# Patient Record
Sex: Male | Born: 1960 | Race: White | Hispanic: No | Smoking: Current every day smoker
Health system: Southern US, Community
[De-identification: ages and names within clinical notes are randomized; demographics above are authoritative.]

---

## 2005-01-05 ENCOUNTER — Emergency Department: Payer: Self-pay | Admitting: Emergency Medicine

## 2011-01-08 ENCOUNTER — Emergency Department: Payer: Self-pay | Admitting: Internal Medicine

## 2015-02-22 ENCOUNTER — Emergency Department
Admission: EM | Admit: 2015-02-22 | Discharge: 2015-02-22 | Disposition: A | Discharge status: Home / Self Care | Payer: Self-pay | Attending: Emergency Medicine | Admitting: Emergency Medicine

## 2015-02-22 ENCOUNTER — Emergency Department: Payer: Self-pay

## 2015-02-22 ENCOUNTER — Encounter: Payer: Self-pay | Admitting: Emergency Medicine

## 2015-02-22 DIAGNOSIS — R109 Unspecified abdominal pain: Secondary | ICD-10-CM | POA: Insufficient documentation

## 2015-02-22 DIAGNOSIS — Z720 Tobacco use: Secondary | ICD-10-CM | POA: Insufficient documentation

## 2015-02-22 LAB — COMPREHENSIVE METABOLIC PANEL
ALK PHOS: 55 U/L (ref 38–126)
ALT: 17 U/L (ref 17–63)
AST: 20 U/L (ref 15–41)
Albumin: 4.1 g/dL (ref 3.5–5.0)
Anion gap: 5 (ref 5–15)
BUN: 13 mg/dL (ref 6–20)
CALCIUM: 9 mg/dL (ref 8.9–10.3)
CO2: 27 mmol/L (ref 22–32)
CREATININE: 0.84 mg/dL (ref 0.61–1.24)
Chloride: 103 mmol/L (ref 101–111)
Glucose, Bld: 119 mg/dL — ABNORMAL HIGH (ref 65–99)
Potassium: 4 mmol/L (ref 3.5–5.1)
Sodium: 135 mmol/L (ref 135–145)
Total Bilirubin: 0.7 mg/dL (ref 0.3–1.2)
Total Protein: 7.6 g/dL (ref 6.5–8.1)

## 2015-02-22 LAB — CBC WITH DIFFERENTIAL/PLATELET
BASOS ABS: 0 10*3/uL (ref 0–0.1)
Basophils Relative: 0 %
Eosinophils Absolute: 0.1 10*3/uL (ref 0–0.7)
Eosinophils Relative: 1 %
HCT: 48.7 % (ref 40.0–52.0)
HEMOGLOBIN: 16.7 g/dL (ref 13.0–18.0)
LYMPHS ABS: 2.4 10*3/uL (ref 1.0–3.6)
LYMPHS PCT: 21 %
MCH: 31 pg (ref 26.0–34.0)
MCHC: 34.3 g/dL (ref 32.0–36.0)
MCV: 90.3 fL (ref 80.0–100.0)
Monocytes Absolute: 0.8 10*3/uL (ref 0.2–1.0)
Monocytes Relative: 7 %
NEUTROS ABS: 8.2 10*3/uL — AB (ref 1.4–6.5)
NEUTROS PCT: 71 %
Platelets: 232 10*3/uL (ref 150–440)
RBC: 5.4 MIL/uL (ref 4.40–5.90)
RDW: 12.9 % (ref 11.5–14.5)
WBC: 11.5 10*3/uL — AB (ref 3.8–10.6)

## 2015-02-22 LAB — URINALYSIS COMPLETE WITH MICROSCOPIC (ARMC ONLY)
BACTERIA UA: NONE SEEN
BILIRUBIN URINE: NEGATIVE
GLUCOSE, UA: NEGATIVE mg/dL
Ketones, ur: NEGATIVE mg/dL
Leukocytes, UA: NEGATIVE
NITRITE: NEGATIVE
Protein, ur: NEGATIVE mg/dL
Specific Gravity, Urine: 1.002 — ABNORMAL LOW (ref 1.005–1.030)
pH: 7 (ref 5.0–8.0)

## 2015-02-22 MED ORDER — MORPHINE SULFATE (PF) 4 MG/ML IV SOLN
INTRAVENOUS | Status: AC
  Administered 2015-02-22: 4 mg via INTRAVENOUS
  Filled 2015-02-22: qty 1

## 2015-02-22 MED ORDER — HYDROCHLOROTHIAZIDE 12.5 MG PO CAPS: 12.5000 mg | ORAL_CAPSULE | Freq: Every day | ORAL | Status: AC

## 2015-02-22 MED ORDER — SODIUM CHLORIDE 0.9 % IV BOLUS (SEPSIS)
1000.0000 mL | Freq: Once | INTRAVENOUS | Status: AC
  Administered 2015-02-22: 1000 mL via INTRAVENOUS

## 2015-02-22 MED ORDER — ONDANSETRON HCL 4 MG/2ML IJ SOLN
4.0000 mg | Freq: Once | INTRAMUSCULAR | Status: AC
  Administered 2015-02-22: 4 mg via INTRAVENOUS

## 2015-02-22 MED ORDER — CYCLOBENZAPRINE HCL 10 MG PO TABS: 10.0000 mg | ORAL_TABLET | Freq: Three times a day (TID) | ORAL | Status: AC | PRN

## 2015-02-22 MED ORDER — ONDANSETRON HCL 4 MG/2ML IJ SOLN
INTRAMUSCULAR | Status: AC
  Administered 2015-02-22: 4 mg via INTRAVENOUS
  Filled 2015-02-22: qty 2

## 2015-02-22 MED ORDER — MORPHINE SULFATE (PF) 4 MG/ML IV SOLN
4.0000 mg | Freq: Once | INTRAVENOUS | Status: AC
Start: 2015-02-22 — End: 2015-02-22
  Administered 2015-02-22: 4 mg via INTRAVENOUS

## 2015-02-22 NOTE — Discharge Instructions (Signed)
Flank Pain °Flank pain refers to pain that is located on the side of the body between the upper abdomen and the back. The pain may occur over a short period of time (acute) or may be long-term or reoccurring (chronic). It may be mild or severe. Flank pain can be caused by many things. °CAUSES  °Some of the more common causes of flank pain include: °· Muscle strains.   °· Muscle spasms.   °· A disease of your spine (vertebral disk disease).   °· A lung infection (pneumonia).   °· Fluid around your lungs (pulmonary edema).   °· A kidney infection.   °· Kidney stones.   °· A very painful skin rash caused by the chickenpox virus (shingles).   °· Gallbladder disease.   °HOME CARE INSTRUCTIONS  °Home care will depend on the cause of your pain. In general, °· Rest as directed by your caregiver. °· Drink enough fluids to keep your urine clear or pale yellow. °· Only take over-the-counter or prescription medicines as directed by your caregiver. Some medicines may help relieve the pain. °· Tell your caregiver about any changes in your pain. °· Follow up with your caregiver as directed. °SEEK IMMEDIATE MEDICAL CARE IF:  °· Your pain is not controlled with medicine.   °· You have new or worsening symptoms. °· Your pain increases.   °· You have abdominal pain.   °· You have shortness of breath.   °· You have persistent nausea or vomiting.   °· You have swelling in your abdomen.   °· You feel faint or pass out.   °· You have blood in your urine. °· You have a fever or persistent symptoms for more than 2-3 days. °· You have a fever and your symptoms suddenly get worse. °MAKE SURE YOU:  °· Understand these instructions. °· Will watch your condition. °· Will get help right away if you are not doing well or get worse. °  °This information is not intended to replace advice given to you by your health care provider. Make sure you discuss any questions you have with your health care provider. °  °Document Released: 06/22/2005 Document  Revised: 01/24/2012 Document Reviewed: 12/14/2011 °Elsevier Interactive Patient Education ©2016 [employer] ° °

## 2015-02-22 NOTE — ED Provider Notes (Signed)
[facility] Emergency Department Provider Note  ____________________________________________  Time seen: Approximately 9:10 AM  I have reviewed the triage vital signs and the nursing notes.   HISTORY  Chief Complaint Flank Pain    HPI Curtis Beck is a 54 y.o. male without any medical history was presenting today with 5 days of left flank pain which is continuous and achy. It is not worsened when the patient pushes on it. He has no injury. He had some difficulty starting and stopping his urinary stream earlier in the week but this has since resolved. He denies any gross blood in his urine. Has a brother with a history of kidney stones. Says he has not seen a physician in "a long time." However, his wife does frequently take his blood pressure with a home blood pressure cuff and has readings of about 160s over 80s. He says his pain is a 1010 at this time.   History reviewed. No pertinent past medical history.  There are no active problems to display for this patient.   History reviewed. No pertinent past surgical history.  No current outpatient prescriptions on file.  Allergies Review of patient's allergies indicates no known allergies.  History reviewed. No pertinent family history.  Social History Social History  Substance Use Topics  . Smoking status: Current Every Day Smoker  . Smokeless tobacco: None  . Alcohol Use: Yes    Review of Systems Constitutional: No fever/chills Eyes: No visual changes. ENT: No sore throat. Cardiovascular: Denies chest pain. Respiratory: Denies shortness of breath. Gastrointestinal: No abdominal pain.  No nausea, no vomiting.  No diarrhea.  No constipation. Genitourinary: Negative for dysuria. Musculoskeletal: Left flank pain.  Skin: Negative for rash. Neurological: Negative for headaches, focal weakness or numbness.  10-point ROS otherwise  negative.  ____________________________________________   PHYSICAL EXAM:  VITAL SIGNS: ED Triage Vitals  Enc Vitals Group     BP 02/22/15 0901 210/113 mmHg     Pulse Rate 02/22/15 0901 78     Resp 02/22/15 0901 20     Temp 02/22/15 0901 97.6 F (36.4 C)     Temp Source 02/22/15 0901 Oral     SpO2 02/22/15 0901 100 %     Weight 02/22/15 0901 220 lb (99.791 kg)     Height 02/22/15 0901  (1.854 m)     Head Cir --      Peak Flow --      Pain Score 02/22/15 0901 10     Pain Loc --      Pain Edu? --      Excl. in GC? --     Constitutional: Alert and oriented. Well appearing and in no acute distress. Eyes: Conjunctivae are normal. PERRL. EOMI. Head: Atraumatic. Nose: No congestion/rhinnorhea. Mouth/Throat: Mucous membranes are moist.  Oropharynx non-erythematous. Neck: No stridor.   Cardiovascular: Normal rate, regular rhythm. Grossly normal heart sounds.  Good peripheral circulation. Patient with equal and present bilateral radial as well as dorsalis pedis pulses. Respiratory: Normal respiratory effort.  No retractions. Lungs CTAB. Gastrointestinal: Soft and nontender. No distention. No abdominal bruits. No CVA tenderness. Musculoskeletal: No lower extremity tenderness nor edema.  No joint effusions. Neurologic:  Normal speech and language. No gross focal neurologic deficits are appreciated. No gait instability. Skin:  Skin is warm, dry and intact. No rash noted. Psychiatric: Mood and affect are normal. Speech and behavior are normal.  ____________________________________________   LABS (all labs ordered are listed, but only abnormal results are  displayed)  Labs Reviewed  CBC WITH DIFFERENTIAL/PLATELET - Abnormal; Notable for the following:    WBC 11.5 (*)    Neutro Abs 8.2 (*)    All other components within normal limits  COMPREHENSIVE METABOLIC PANEL - Abnormal; Notable for the following:    Glucose, Bld 119 (*)    All other components within normal limits   URINALYSIS COMPLETEWITH MICROSCOPIC ([facility] ONLY) - Abnormal; Notable for the following:    Color, Urine STRAW (*)    APPearance CLEAR (*)    Specific Gravity, Urine 1.002 (*)    Hgb urine dipstick 2+ (*)    Squamous Epithelial / LPF 0-5 (*)    All other components within normal limits   ____________________________________________  EKG   ____________________________________________  RADIOLOGY  No nephrolithiasis. Rectal thickening of unclear origin. ____________________________________________   PROCEDURES    ____________________________________________   INITIAL IMPRESSION / ASSESSMENT AND PLAN / ED COURSE  Pertinent labs & imaging results that were available during my care of the patient were reviewed by me and considered in my medical decision making (see chart for details).  ----------------------------------------- 11:52 AM on 02/22/2015 -----------------------------------------  Patient is resting comfortably at this time. He says he still has a throbbing to his left lower back but it is now 4 out of 10. He says that he has had this pain in the past and it has been related to working under the car's. 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 However he says it has not lasted this long in the past. He says that here for any gave him some Percocet at home which relieved the pain. Less likely to be aortic pathology given intact pulses throughout and has had these symptoms since this past Wednesday. I would suspect that he would have further lab abnormality such as kidney failure or worsening of symptoms if this was an aortic catastrophe. I will start the patient on hydrochlorothiazide for his high blood pressure which does appear chronic. I also discussed CAT scan read with him including the rectal thickening. He has not had a colonoscopy. He also denies a history of colon cancer in his family. He will take  the report with him to his follow-up appointment at [facility]. ____________________________________________   FINAL CLINICAL IMPRESSION(S) / ED DIAGNOSES  Final diagnoses:  Left flank pain      Myrna Blazer, MD 02/22/15 1155

## 2015-02-22 NOTE — ED Notes (Signed)
Pt to ed with c/o left flank pain since Wed.  Pt states the pain is continuous and achy.  Pt reports on wed and Thursday he had trouble starting and stopping the flow of urine but since has not had difficulty with urination.

## 2015-07-14 DEATH — deceased

## 2017-02-09 IMAGING — CT CT RENAL STONE PROTOCOL
1 of 2 series · 15 of 32 positions shown, 19 images · non-contrast
Comparison: Report 01/05/2005 no images available

CLINICAL DATA: Left flank pain starting last [REDACTED]

EXAM:
CT ABDOMEN AND PELVIS WITHOUT CONTRAST
TECHNIQUE: Multidetector CT imaging of the abdomen and pelvis was performed
following the standard protocol without IV contrast.

[Series 2: stone standard full · axial · 0.80mm/px · z∈[-252,+232]mm · 15 of 107 slices shown, 19 images]
[im 5/107  soft-tissue]
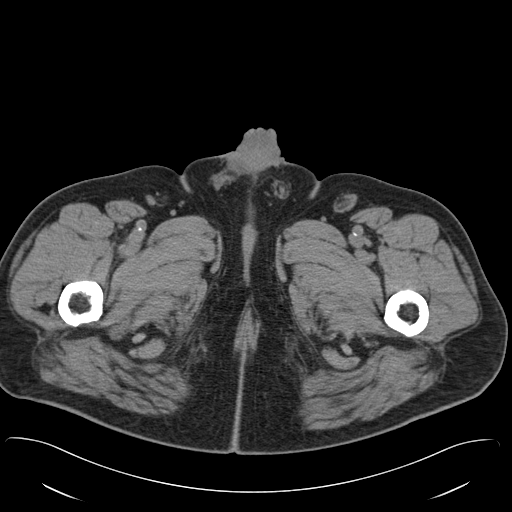
[im 5/107  bone]
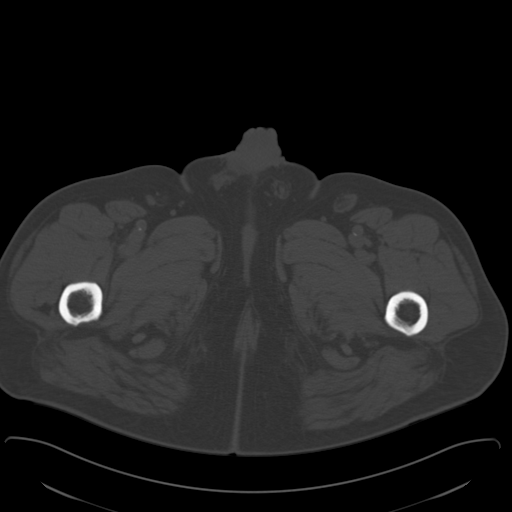
[im 14/107  soft-tissue]
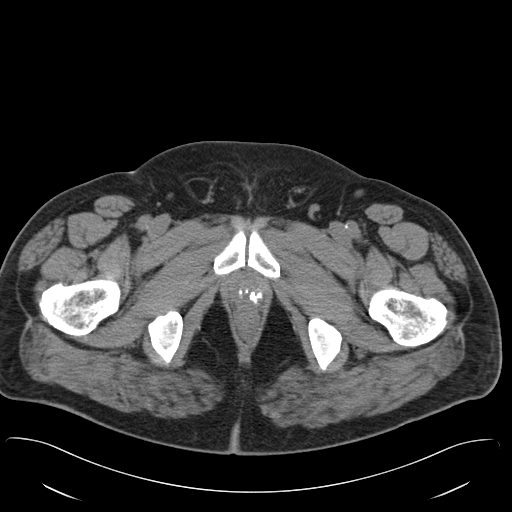
[im 24/107  soft-tissue]
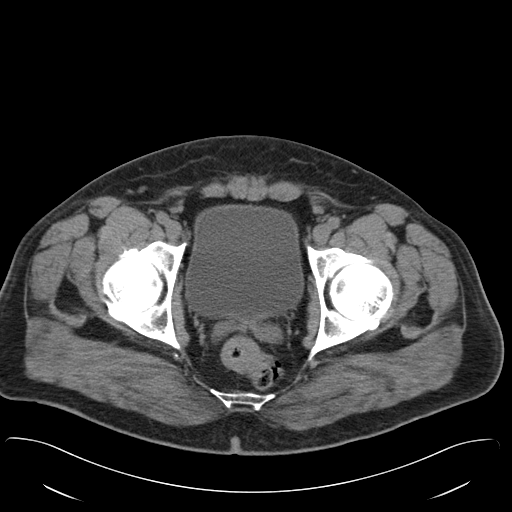
[im 28/107  soft-tissue]
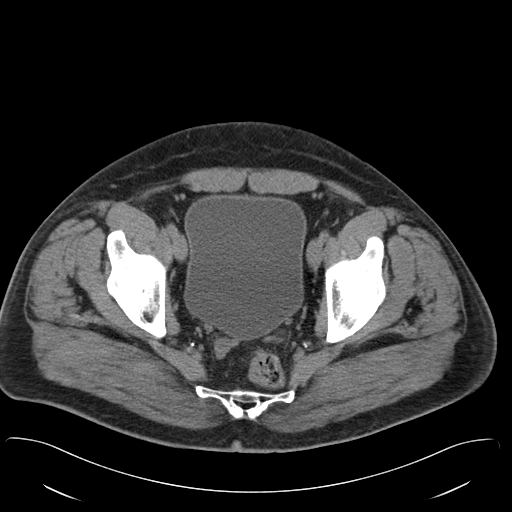
[im 37/107  soft-tissue]
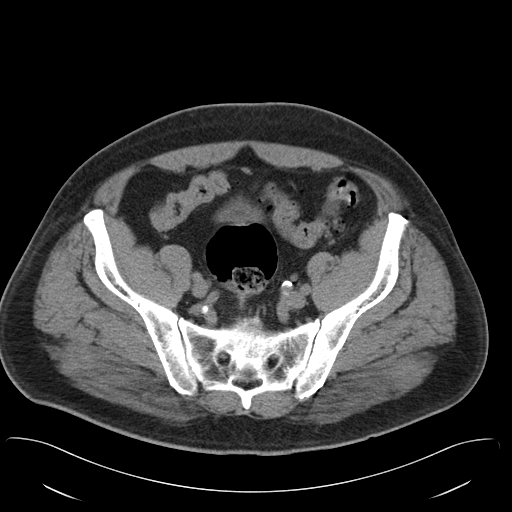
[im 47/107  soft-tissue]
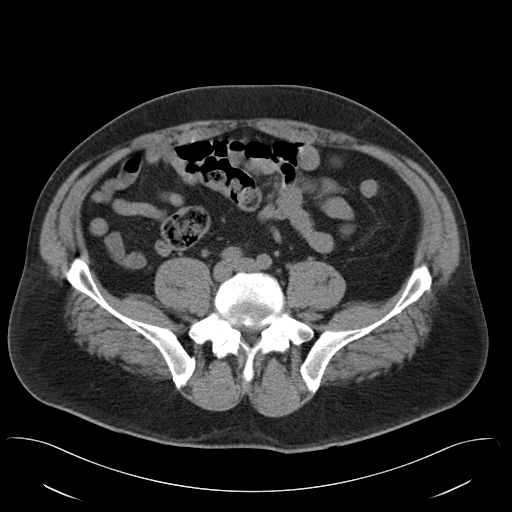
[im 56/107  soft-tissue]
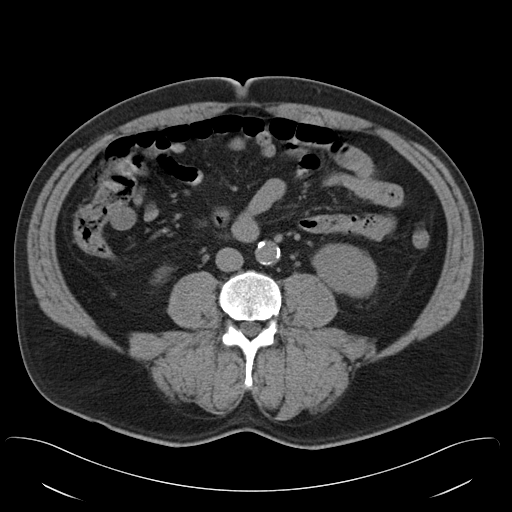
[im 60/107  soft-tissue]
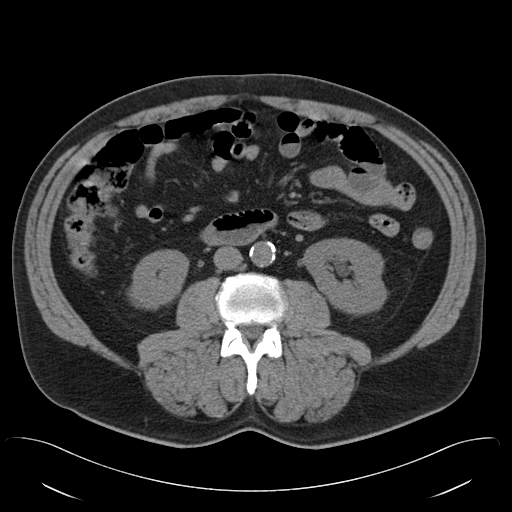
[im 70/107  soft-tissue]
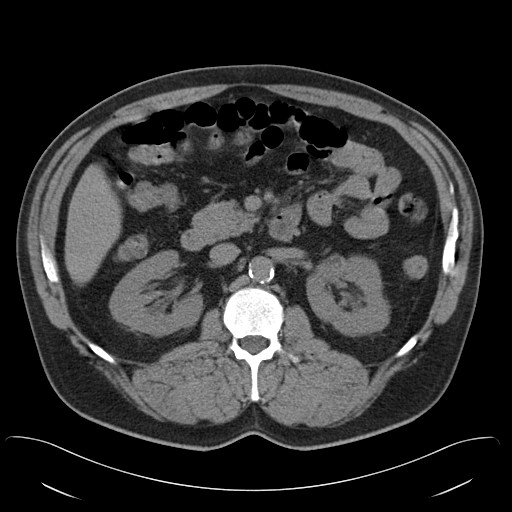
[im 70/107  bone]
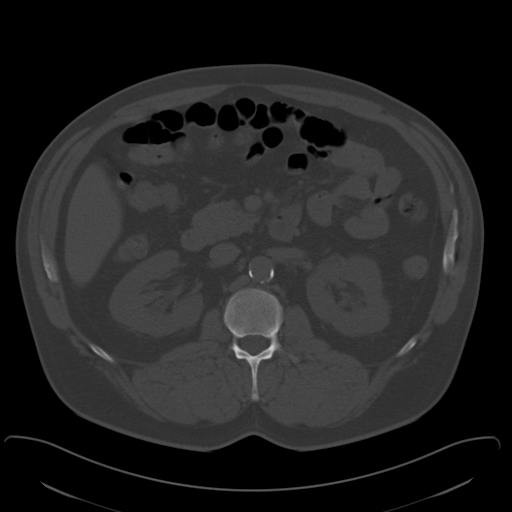
[im 79/107  soft-tissue]
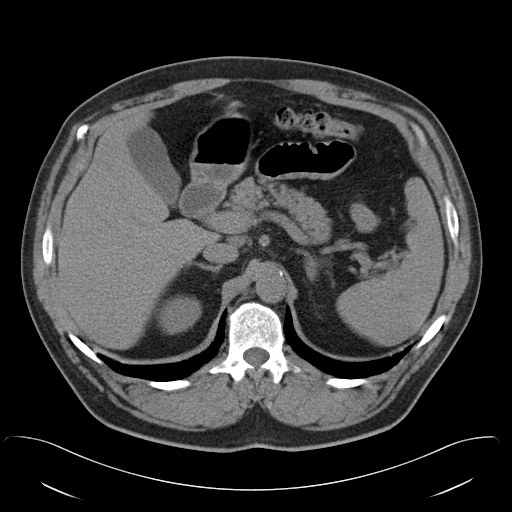
[im 83/107  soft-tissue]
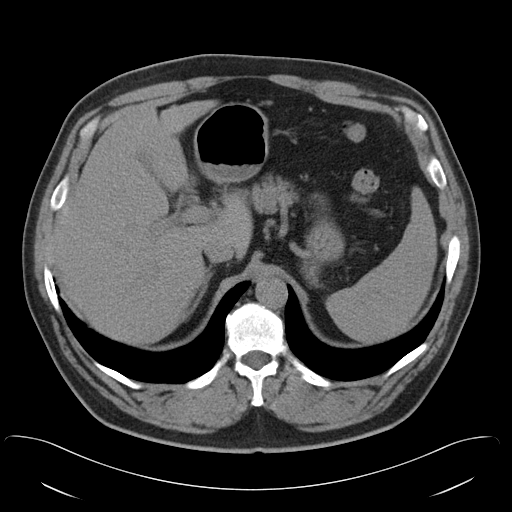
[im 88/107  lung]
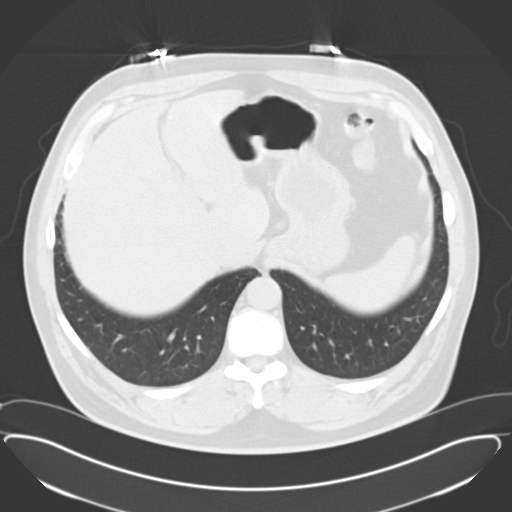
[im 93/107  soft-tissue]
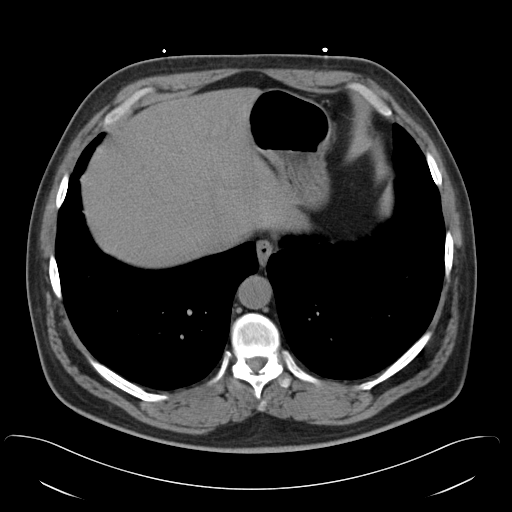
[im 93/107  lung]
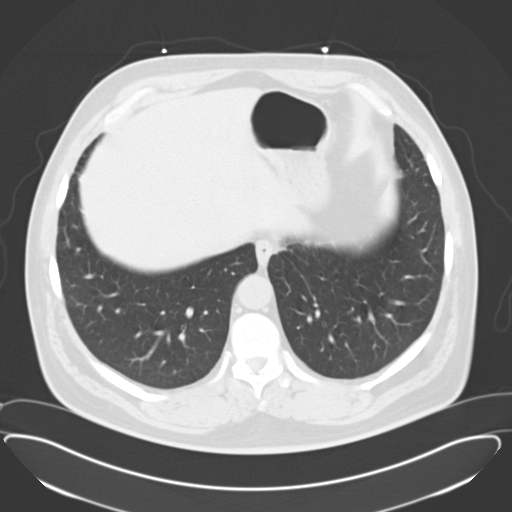
[im 97/107  lung]
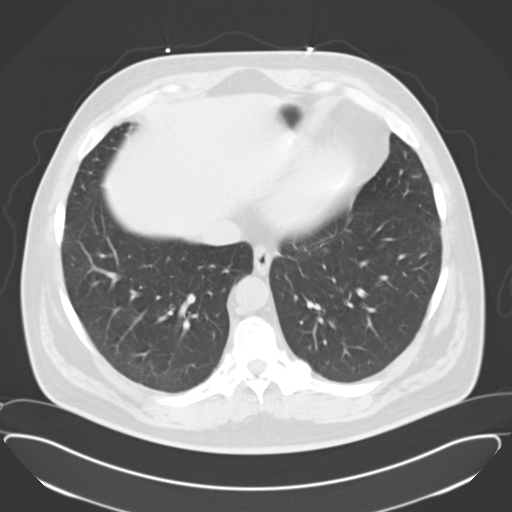
[im 102/107  soft-tissue]
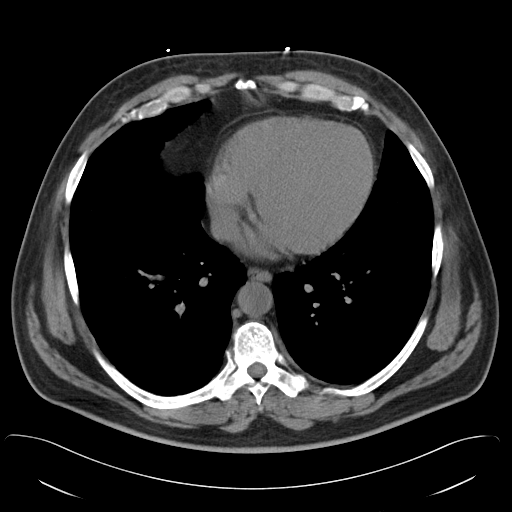
[im 102/107  lung]
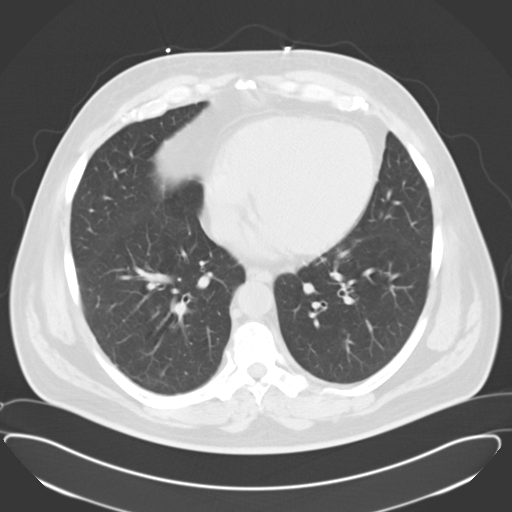

[15 of 32 positions shown; findings below may reference images not displayed]

FINDINGS: Lung bases are unremarkable. Sagittal images of the spine shows mild
degenerative changes thoracolumbar spine. Unenhanced liver shows no
biliary ductal dilatation. No calcified gallstones are noted within
gallbladder. Unenhanced pancreas spleen and adrenal glands are
unremarkable. Atherosclerotic calcifications of abdominal aorta and
iliac arteries. No aortic aneurysm.

No small bowel obstruction.  No ascites or free air.  No adenopathy.

There is minimal dilatation of bilateral renal collecting system
without frank hydronephrosis. No nephrolithiasis. No calcified
ureteral calculi. No calcified calculi are noted within distended
urinary bladder. Prostate gland calcifications are noted. Seminal
vesicles are unremarkable.

There is normal appendix. No pericecal inflammation. The terminal
ileum is unremarkable. Sigmoid colon diverticula are noted. No
evidence of acute diverticulitis. Nonspecific mild thickening of
rectal wall. Please see axial image 86. No definite evidence of mass
on this unenhanced scan. There is a small umbilical hernia
containing fat without evidence of acute complication.
IMPRESSION: 1. There is no nephrolithiasis. No hydronephrosis or hydroureter.
Mild dilatation bilateral renal collecting system without frank
hydronephrosis.
2. No calcified ureteral calculi.
3. Normal appendix.  No pericecal inflammation.
4. Sigmoid colon diverticula. No evidence of acute diverticulitis.
Small umbilical hernia containing fat without evidence of acute
complication.
5. Nonspecific mild thickening of rectal wall. Please see axial
image 86. No definite evidence of mass on this unenhanced scan.
